# Patient Record
Sex: Male | Born: 1990 | Race: White | Hispanic: No | Marital: Married | Smoking: Never smoker
Health system: Southern US, Community
[De-identification: ages and names within clinical notes are randomized; demographics above are authoritative.]

---

## 2020-02-03 ENCOUNTER — Encounter (HOSPITAL_COMMUNITY): Payer: Self-pay

## 2020-02-03 ENCOUNTER — Ambulatory Visit (INDEPENDENT_AMBULATORY_CARE_PROVIDER_SITE_OTHER): Payer: No Typology Code available for payment source

## 2020-02-03 ENCOUNTER — Other Ambulatory Visit: Payer: Self-pay

## 2020-02-03 ENCOUNTER — Ambulatory Visit (HOSPITAL_COMMUNITY)
Admission: EM | Admit: 2020-02-03 | Discharge: 2020-02-03 | Disposition: A | Discharge status: Home / Self Care | Payer: No Typology Code available for payment source | Attending: Family Medicine | Admitting: Family Medicine

## 2020-02-03 DIAGNOSIS — M25521 Pain in right elbow: Secondary | ICD-10-CM | POA: Diagnosis not present

## 2020-02-03 DIAGNOSIS — M7711 Lateral epicondylitis, right elbow: Secondary | ICD-10-CM | POA: Diagnosis not present

## 2020-02-03 MED ORDER — MELOXICAM 15 MG PO TABS: 15.0000 mg | ORAL_TABLET | Freq: Every day | ORAL | 0 refills | Status: AC

## 2020-02-03 NOTE — Discharge Instructions (Signed)
Avoid heavy use of arm while painful Take the meloxicam once a day Do not take advil or aleve while on the mobic, may take acetaminophen Use ice when painful Massage frequently See your doctor later this month

## 2020-02-03 NOTE — ED Triage Notes (Signed)
Pt presents with right elbow pain X 1 month that is non injury related.

## 2020-02-03 NOTE — ED Provider Notes (Signed)
[facility]    CSN: [id number] Arrival date & time: 02/03/20  0946      History   Chief Complaint Chief Complaint  Patient presents with  . Elbow Pain    HPI Edwin Lowe is a 30 y.o. male.   HPI   Right elbow pain since September.  Patient is right-handed.  He started out when he was doing a lot of upper body strengthening in the gym.  It is persisting.  His current job is as a Health visitor and arms modifications.  He still uses his hands a lot in his job. Elbow hurts every day.  Some days worse than others He has not any treatment or evaluation for the elbow pain to date Sometimes gets numbness in the fourth and fifth fingers of the right hand Denies trauma, injury, prior trauma or fracture States that his elbow is very stiff when he gets up in the morning  History reviewed. No pertinent past medical history.  There are no problems to display for this patient.   History reviewed. No pertinent surgical history.     Home Medications    Prior to Admission medications   Medication Sig Start Date End Date Taking? Authorizing Provider  meloxicam (MOBIC) 15 MG tablet Take 1 tablet (15 mg total) by mouth daily. 02/03/20  Yes Eustace Moore, MD    Family History Family History  Family history unknown: Yes    Social History Social History   Tobacco Use  . Smoking status: Never Smoker     Allergies   Patient has no known allergies.   Review of Systems Review of Systems See HPI  Physical Exam Triage Vital Signs ED Triage Vitals  Enc Vitals Group     BP 02/03/20 1148 (!) 152/77     Pulse Rate 02/03/20 1148 71     Resp 02/03/20 1148 20     Temp 02/03/20 1148 97.9 F (36.6 C)     Temp Source 02/03/20 1148 Oral     SpO2 02/03/20 1148 97 %     Weight --      Height --      Head Circumference --      Peak Flow --      Pain Score 02/03/20 1147 7     Pain Loc --      Pain Edu? --      Excl. in GC? --    No data found.  Updated  Vital Signs BP (!) 152/77 (BP Location: Left Arm)   Pulse 71   Temp 97.9 F (36.6 C) (Oral)   Resp 20   SpO2 97%      Physical Exam   UC Treatments / Results  Labs (all labs ordered are listed, but only abnormal results are displayed) Labs Reviewed - No data to display  EKG   Radiology DG Elbow Complete Right  Result Date: 02/03/2020 CLINICAL DATA:  Right elbow pain for the past month. No known injury. EXAM: RIGHT ELBOW - COMPLETE 3+ VIEW COMPARISON:  None. FINDINGS: There is no evidence of fracture, dislocation, or joint effusion. There is no evidence of arthropathy or other focal bone abnormality. Soft tissues are unremarkable. IMPRESSION: Normal examination. Electronically Signed   By: Beckie Salts M.D.   On: 02/03/2020 13:51  X-rays performed because of the complaint of morning stiffness.  This is not typical for tendinitis.  It is normal  Procedures Procedures (including critical care time)  Medications Ordered in UC Medications -  No data to display  Initial Impression / Assessment and Plan / UC Course  I have reviewed the triage vital signs and the nursing notes.  Pertinent labs & imaging results that were available during my care of the patient were reviewed by me and considered in my medical decision making (see chart for details).     Reviewed tendinitis.  Treatment.  Reasons for referral.  He does have an appointment at the [facility] hospital in the end of January.  They can order physical therapy if it is indicated.  Discussed elbow strap Final Clinical Impressions(s) / UC Diagnoses   Final diagnoses:  Lateral epicondylitis of right elbow     Discharge Instructions     Avoid heavy use of arm while painful Take the meloxicam once a day Do not take advil or aleve while on the mobic, may take acetaminophen Use ice when painful Massage frequently See your doctor later this month   ED Prescriptions    Medication Sig Dispense Auth. Provider   meloxicam (MOBIC)  15 MG tablet Take 1 tablet (15 mg total) by mouth daily. 30 tablet Eustace Moore, MD     PDMP not reviewed this encounter.   Eustace Moore, MD 02/03/20 2006

## 2022-04-13 IMAGING — DX DG ELBOW COMPLETE 3+V*R*
4 series · 4 of 4 positions shown · non-contrast
Comparison: None.

CLINICAL DATA: Right elbow pain for the past month. No known
injury.

EXAM:
RIGHT ELBOW - COMPLETE 3+ VIEW

[elbow ap]
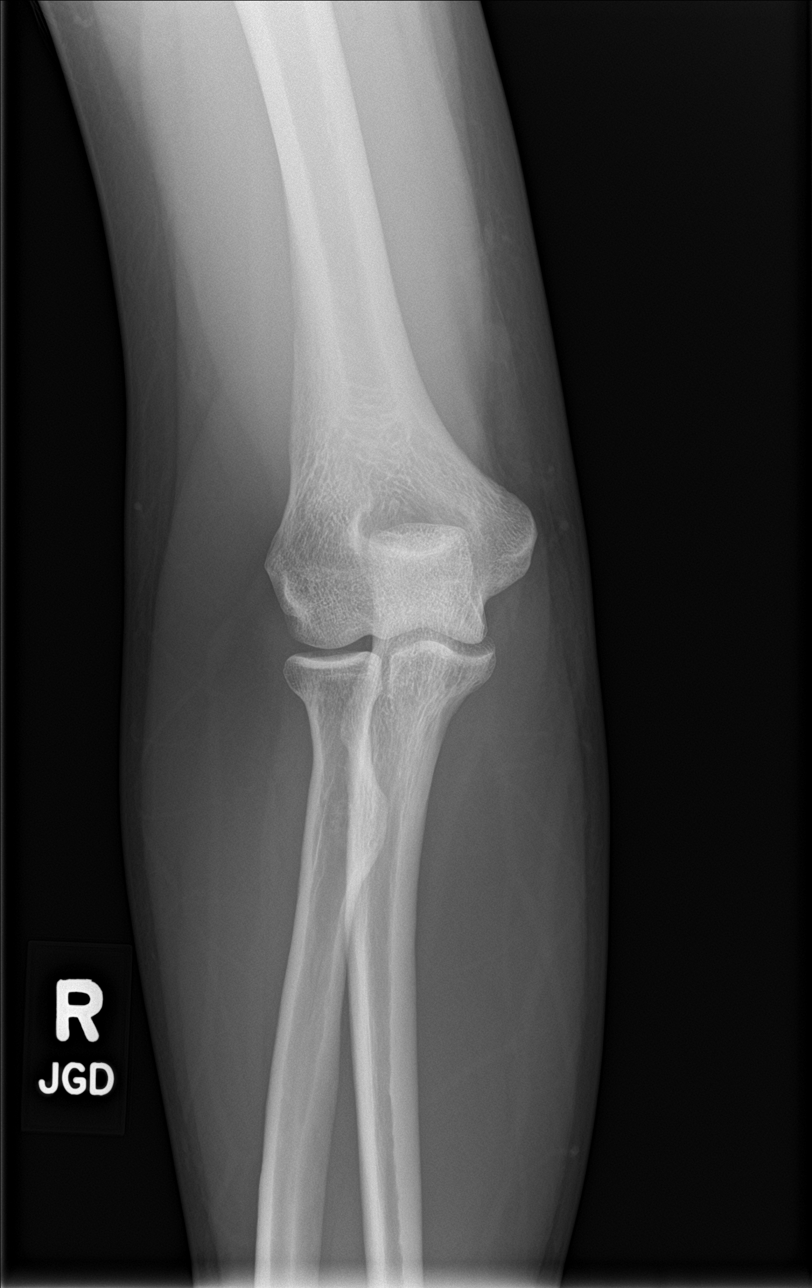

[elbow obl (1 of 2)]
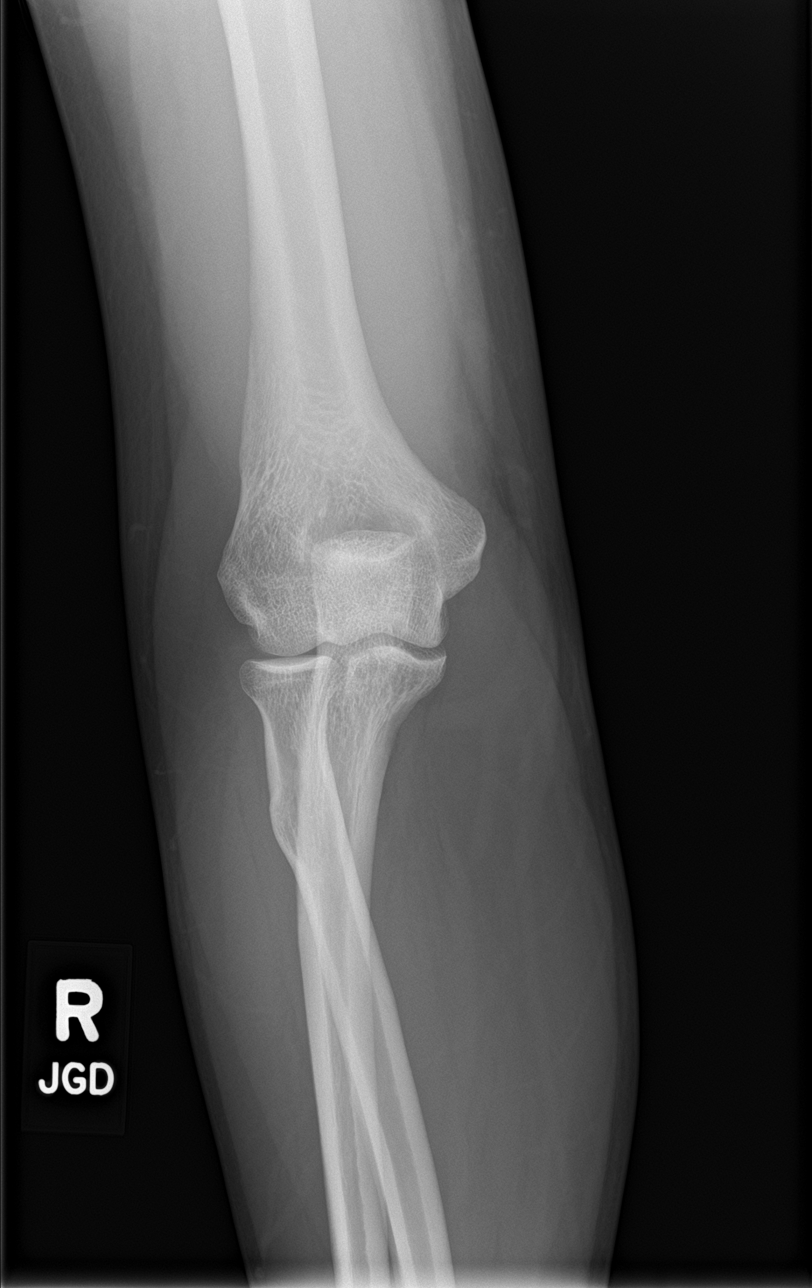

[elbow obl (2 of 2)]
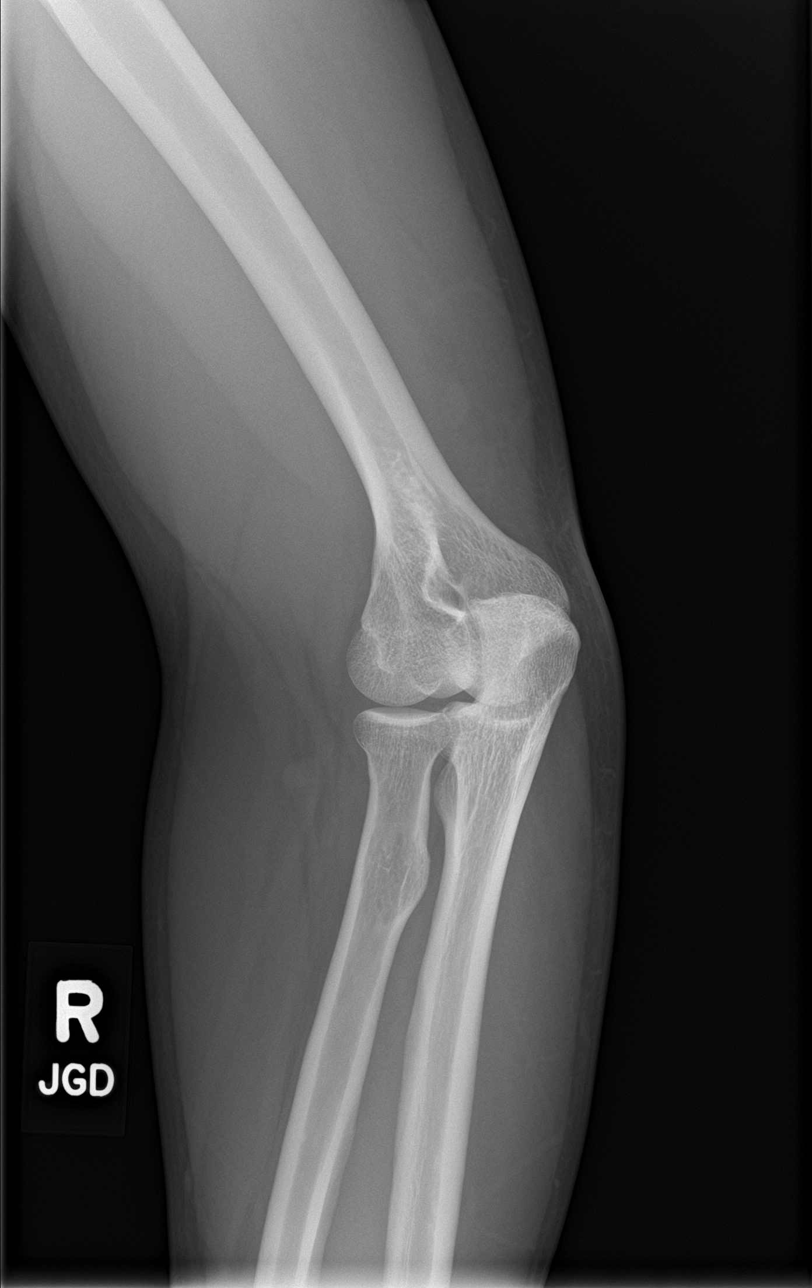

[elbow lat]
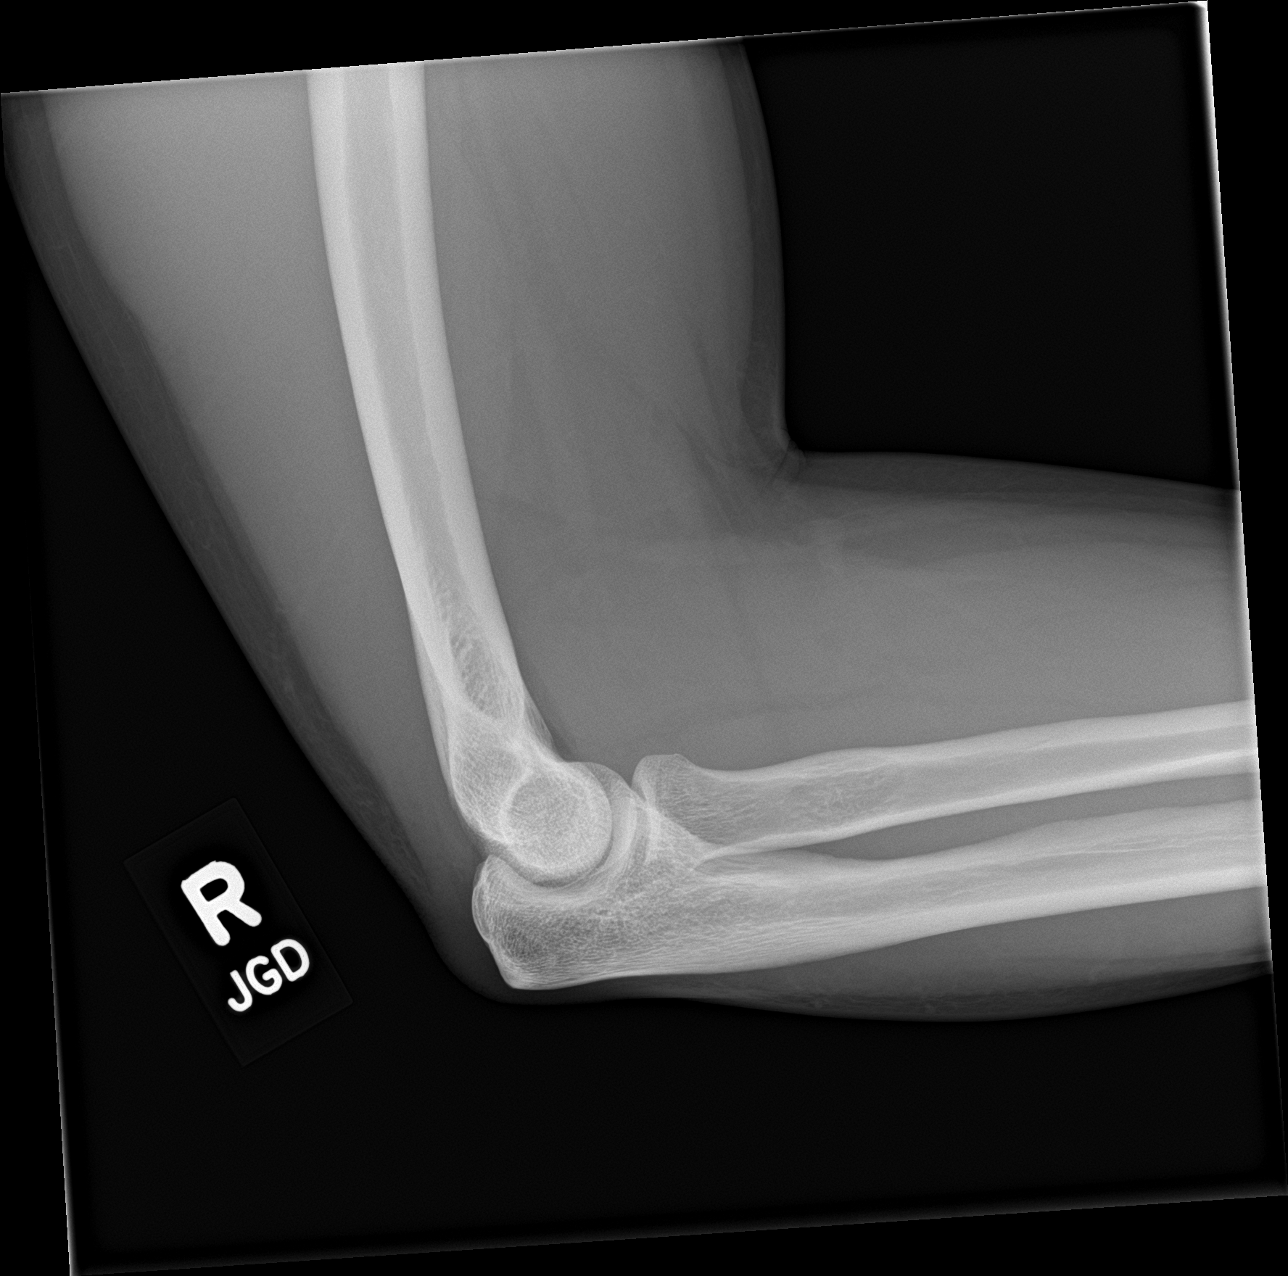

[4 of 4 positions shown; findings below may reference images not displayed]

FINDINGS: There is no evidence of fracture, dislocation, or joint effusion.
There is no evidence of arthropathy or other focal bone abnormality.
Soft tissues are unremarkable.
IMPRESSION: Normal examination.
